# Patient Record
Sex: Male | Born: 1995 | Race: White | Hispanic: No | Marital: Single | Smoking: Current every day smoker
Health system: Southern US, Community
[De-identification: ages and names within clinical notes are randomized; demographics above are authoritative.]

## PROBLEM LIST (undated history)

## (undated) DIAGNOSIS — F32A Depression, unspecified: Secondary | ICD-10-CM

## (undated) DIAGNOSIS — F419 Anxiety disorder, unspecified: Secondary | ICD-10-CM

## (undated) DIAGNOSIS — F329 Major depressive disorder, single episode, unspecified: Secondary | ICD-10-CM

## (undated) HISTORY — DX: Major depressive disorder, single episode, unspecified: F32.9

## (undated) HISTORY — DX: Depression, unspecified: F32.A

## (undated) HISTORY — PX: OTHER SURGICAL HISTORY: SHX169

## (undated) HISTORY — DX: Anxiety disorder, unspecified: F41.9

---

## 2018-02-22 ENCOUNTER — Telehealth: Payer: Self-pay | Admitting: Family Medicine

## 2018-02-22 NOTE — Telephone Encounter (Signed)
Called pt. To reschedule visit with Dr. Clelia CroftShaw on 04/05/18. Left Detailed VM

## 2018-04-05 ENCOUNTER — Ambulatory Visit: Payer: Self-pay | Admitting: Family Medicine

## 2018-04-25 ENCOUNTER — Encounter: Payer: Self-pay | Admitting: Family Medicine

## 2018-04-25 ENCOUNTER — Other Ambulatory Visit: Payer: Self-pay

## 2018-04-25 ENCOUNTER — Ambulatory Visit (INDEPENDENT_AMBULATORY_CARE_PROVIDER_SITE_OTHER): Payer: Managed Care, Other (non HMO) | Admitting: Family Medicine

## 2018-04-25 VITALS — BP 126/82 | HR 71 | Temp 98.0°F | Resp 16 | Ht 68.5 in | Wt 142.0 lb

## 2018-04-25 DIAGNOSIS — F5101 Primary insomnia: Secondary | ICD-10-CM

## 2018-04-25 DIAGNOSIS — F640 Transsexualism: Secondary | ICD-10-CM | POA: Diagnosis not present

## 2018-04-25 DIAGNOSIS — Z79899 Other long term (current) drug therapy: Secondary | ICD-10-CM

## 2018-04-25 DIAGNOSIS — IMO0001 Reserved for inherently not codable concepts without codable children: Secondary | ICD-10-CM

## 2018-04-25 DIAGNOSIS — Z113 Encounter for screening for infections with a predominantly sexual mode of transmission: Secondary | ICD-10-CM

## 2018-04-25 MED ORDER — ESTRADIOL 2 MG PO TABS: ORAL_TABLET | ORAL | 1 refills | Status: AC

## 2018-04-25 MED ORDER — SPIRONOLACTONE 100 MG PO TABS: 100.0000 mg | ORAL_TABLET | Freq: Two times a day (BID) | ORAL | 1 refills | Status: AC

## 2018-04-25 MED ORDER — TRAZODONE HCL 100 MG PO TABS: 100.0000 mg | ORAL_TABLET | Freq: Every day | ORAL | 1 refills | Status: AC

## 2018-04-25 NOTE — Progress Notes (Signed)
Subjective:    Patient: Derek Buckley  DOB: 04/20/1996; 22 y.o.   MRN: [id number]  Chief Complaint  Patient presents with  . Establish Care    hormonal Treatment     HPI Testosterone aggression was really tense and angry all the time but has resolved with starting on hormone therapy on 12/14/2016.  Saw TOL prior for mental health clearance.  Ran out of estradiol Body hair is thicker darker since Emotionally flat  Never tried pills to quit smoking.  Mother took chantix and had horrible dreams Quit 6-7 mos in 2016-2017 - switched over to e-cigs 30  Ml every 2-3 at 67m nicotine and then cut in half. Has been able to switch over to e-cigs for a couple of weeks but can't afford.   Not needed trx for anxiety/depression since 2013 - did not do well on medications as has a lot of psych issues surrounding taking meds. Anxiety and depression stem from the insomnia which responds well to trazodone. She built up a tolerance so has changed to prn. Started taking trazodone 50 in July 2019 but after 4 mos wasn't knocking her at as much.  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 Interested in bottom surgery, orchiectomy, and facial feminization surgery.   STD screening today - last void ~3-4 hrs prior.    THinks got HPV series.   Medical History Past Medical History:  Diagnosis Date  . Anxiety   . Depression    Past Surgical History:  Procedure Laterality Date  . Broken Arm     Current Outpatient Medications on File Prior to Visit  Medication Sig Dispense Refill  . EPINEPHrine (EPIPEN JR) 0.15 MG/0.3ML injection Inject into the muscle.    . estradiol (ESTRACE) 2 MG tablet TAKE 1 TABLET BY MOUTH TWICE A DAY    . spironolactone (ALDACTONE) 100 MG tablet TAKE 1 TABLET BY MOUTH TWICE A DAY    . traZODone (DESYREL) 50 MG tablet TAKE 1 TABLET BY MOUTH EVERY DAY AT NIGHT     No current facility-administered medications  on file prior to visit.    Not on File Family History  Problem Relation Age of Onset  . Cancer Father   . Cancer Maternal Grandmother   . Heart disease Maternal Grandfather   . Heart disease Paternal Grandfather   . Stroke Paternal Grandfather    Social History   Socioeconomic History  . Marital status: Single    Spouse name: Not on file  . Number of children: Not on file  . Years of education: Not on file  . Highest education level: Not on file  Occupational History  . Not on file  Social Needs  . Financial resource strain: Not on file  . Food insecurity:    Worry: Not on file    Inability: Not on file  . Transportation needs:    Medical: Not on file    Non-medical: Not on file  Tobacco Use  . Smoking status: Current Every Day Smoker  . Smokeless tobacco: Never Used  Substance and Sexual Activity  . Alcohol use: Yes  . Drug use: Never  . Sexual activity: Not on file  Lifestyle  . Physical activity:    Days per week: Not on file    Minutes per session: Not on file  . Stress: Not on file  Relationships  . Social connections:    Talks on phone:  Not on file    Gets together: Not on file    Attends religious service: Not on file    Active member of club or organization: Not on file    Attends meetings of clubs or organizations: Not on file    Relationship status: Not on file  Other Topics Concern  . Not on file  Social History Narrative  . Not on file   No flowsheet data found.  ROS As noted in HPI  Objective:  BP 126/82   Pulse 71   Temp 98 F (36.7 C) (Oral)   Resp 16   Ht 5' 8.5" (1.74 m)   Wt 142 lb (64.4 kg)   SpO2 99%   BMI 21.27 kg/m  Physical Exam  Constitutional: She is oriented to person, place, and time. She appears well-developed and well-nourished. No distress.  HENT:  Head: Normocephalic and atraumatic.  Right Ear: External ear normal.  Left Ear: External ear normal.  Eyes: Conjunctivae are normal. No scleral icterus.  Neck:  Normal range of motion. Neck supple. No thyromegaly present.  Cardiovascular: Normal rate, regular rhythm, normal heart sounds and intact distal pulses.  Pulmonary/Chest: Effort normal and breath sounds normal. No respiratory distress.  Musculoskeletal: She exhibits no edema.  Lymphadenopathy:    She has no cervical adenopathy.  Neurological: She is alert and oriented to person, place, and time.  Skin: Skin is warm and dry. She is not diaphoretic. No erythema.  Psychiatric: She has a normal mood and affect. Her behavior is normal.    POC TESTING none  Assessment & Plan:   1. Encounter for long-term current use of high risk medication   2. Screen for STD (sexually transmitted disease)   3. Primary insomnia   4. 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   Reviewed that the physiologic and psychologic changes are unpredictable, can be uncontrollable, and may be irreversible. We discussed that there can be no expectations from cross-sex hormone treatment as how each individual's body and mind is going to respond to a certain hormone regimen to eventually arrive at a certain configuration of sex characteristics is unknown and unpredictable.  Pt understands and agrees. Demonstrates excellent knowledge of the risks/benefits of hormonal treatment which she has reviewed in detail on her own and with therapist prior. Discussed in detail that starting hormonal treatment could cause infertility - may not ever be able to conceive a child naturally or produce sufficient sperm for artificial or in vitro fertilization, even if she should choose to go off of estrogen and anti-androgens later in life. She is not concerned about  this and states that if unable to conceive a child naturally due to previous hormone therapy, would both be happy to adopt. Discussed in detail the possible increased risk for the development of cardiac conditions, vascular disease, and diabetes as baseline higher risk from being genetically male may not be decreased with hormone therapy and could be exacerbated by increased weight/body fat as well as increased thrombotic risk from estrogen.   Also could experience acne, emotional lability, worsening depression/mood sxs, change/decrease in libido, erectile dysfunction, weight gain, migraines, thrombosis/venous thromboembolism, potential to develop/worsen autoimmune disease, potential for benign and malignant liver tumors and  hepatic dysfunction, potential for development of pituitary adenomas/prolactinomas and galactorrhea, and increased risk of breast cancer, in addition to other unknown or unpredictable effects.  Discussed that the physiologic changes induced by hormones may take anywhere from 3 mos to 2 yrs to begin and may even take up to 5 years to reach full effect.    Derek Buckley brought letter from psychologist at [facility] stating that she has undergone psychological assessment to fulfill the requirements for cross-sex hormone treatment. Has met all the eligibility and readiness criteria outlined in the office WPATH Standards of Care, 7th edition for the treatment of transgendered individuals. Is competent, has the capacity to make a fully informed decision, and is capable of consenting to treatment. Derek Buckley is a fit candidate for cross-sex hormone therapy.  Patient will continue on current chronic medications other than changes noted above, so ok to refill when needed.   See after visit summary for patient specific instructions.  Orders Placed This Encounter  Procedures  . CBC with Differential/Platelet  . Comprehensive metabolic panel  . Estradiol  .  TestT+TestF+SHBG    Meds ordered this encounter  Medications  . estradiol (ESTRACE) 2 MG tablet    Sig: Take 1 tab po qam and 2 tabs po qhs    Dispense:  270 tablet    Refill:  1  . spironolactone (ALDACTONE) 100 MG tablet    Sig: Take 1 tablet (100 mg total) by mouth 2 (two) times daily.    Dispense:  180 tablet    Refill:  1  . traZODone (DESYREL) 100 MG tablet    Sig: Take 1 tablet (100 mg total) by mouth at bedtime.    Dispense:  90 tablet    Refill:  1    Patient verbalized to me that they understand the following: diagnosis, what is being done for them, what to expect and what should be done at home.  Their questions have been answered. They understand that I am unable to predict every possible medication interaction or adverse outcome and that if any unexpected symptoms arise, they should contact us and their pharmacist, as well as never hesitate to seek urgent/emergent care at [facility] or ER if they think it might be warranted.    Delman Cheadle, MD, MPH [facility] [address] [place], [place]  [address] [phone] Office phone  [phone] Office fax  04/25/18 11:03 AM

## 2018-04-25 NOTE — Patient Instructions (Addendum)
If you would like, you are more than welcome to come in for a lab only visit to have your blood drawn ~4-6 days prior to our next scheduled office visit so that the results are available for us to review together and discuss any implications during your visit.  You do NOT need to fasting for your labs.  You may also walk in at any time for your flu shot. When you have your labs drawn, try to make sure it is at least 3-4 hrs AFTER your morning estradiol dose OR hold your morning estradiol dose and don't take it until after your labs are drawn that day if you are going to come in in the morning for blood work/appt.    If you have lab work done today you will be contacted with your lab results within the next 2 weeks.  If you have not heard from us then please contact us. The fastest way to get your results is to register for My Chart.   IF you received an x-ray today, you will receive an invoice from [facility]. Please contact [facility] at [phone] with questions or concerns regarding your invoice.   IF you received labwork today, you will receive an invoice from [facility]. Please contact [facility] at [phone] with questions or concerns regarding your invoice.   Our billing staff will not be able to assist you with questions regarding bills from these companies.  You will be contacted with the lab results as soon as they are available. The fastest way to get your results is to activate your My Chart account. Instructions are located on the last page of this paperwork. If you have not heard from us regarding the results in 2 weeks, please contact this office.     Coping with Quitting Smoking Quitting smoking is a physical and mental challenge. You will face cravings, withdrawal symptoms, and temptation. Before quitting, work with your health care provider to make a plan that can help you cope. Preparation can help you quit and keep you from giving in. How can I cope  with cravings? Cravings usually last for 5-10 minutes. If you get through it, the craving will pass. Consider taking the following actions to help you cope with cravings:  Keep your mouth busy: ? Chew sugar-free gum. ? Suck on hard candies or a straw. ? Brush your teeth.  Keep your hands and body busy: ? Immediately change to a different activity when you feel a craving. ? Squeeze or play with a ball. ? Do an activity or a hobby, like making bead jewelry, practicing needlepoint, or working with wood. ? Mix up your normal routine. ? Take a short exercise break. Go for a quick walk or run up and down stairs. ? Spend time in public places where smoking is not allowed.  Focus on doing something kind or helpful for someone else.  Call a friend or family member to talk during a craving.  Join a support group.  Call a quit line, such as 1-800-QUIT-NOW.  Talk with your health care provider about medicines that might help you cope with cravings and make quitting easier for you.  How can I deal with withdrawal symptoms? Your body may experience negative effects as it tries to get used to not having nicotine in the system. These effects are called withdrawal symptoms. They may include:  Feeling hungrier than normal.  Trouble concentrating.  Irritability.  Trouble sleeping.  Feeling depressed.  Restlessness and agitation.  Craving a cigarette.  To manage withdrawal symptoms:  Avoid places, people, and activities that trigger your cravings.  Remember why you want to quit.  Get plenty of sleep.  Avoid coffee and other caffeinated drinks. These may worsen some of your symptoms.  How can I handle social situations? Social situations can be difficult when you are quitting smoking, especially in the first few weeks. To manage this, you can:  Avoid parties, bars, and other social situations where people might be smoking.  Avoid alcohol.  Leave right away if you have the urge  to smoke.  Explain to your family and friends that you are quitting smoking. Ask for understanding and support.  Plan activities with friends or family where smoking is not an option.  What are some ways I can cope with stress? Wanting to smoke may cause stress, and stress can make you want to smoke. Find ways to manage your stress. Relaxation techniques can help. For example:  Breathe slowly and deeply, in through your nose and out through your mouth.  Listen to soothing, relaxing music.  Talk with a family member or friend about your stress.  Light a candle.  Soak in a bath or take a shower.  Think about a peaceful place.  What are some ways I can prevent weight gain? Be aware that many people gain weight after they quit smoking. However, not everyone does. To keep from gaining weight, have a plan in place before you quit and stick to the plan after you quit. Your plan should include:  Having healthy snacks. When you have a craving, it may help to: ? Eat plain popcorn, crunchy carrots, celery, or other cut vegetables. ? Chew sugar-free gum.  Changing how you eat: ? Eat small portion sizes at meals. ? Eat 4-6 small meals throughout the day instead of 1-2 large meals a day. ? Be mindful when you eat. Do not watch television or do other things that might distract you as you eat.  Exercising regularly: ? Make time to exercise each day. If you do not have time for a long workout, do short bouts of exercise for 5-10 minutes several times a day. ? Do some form of strengthening exercise, like weight lifting, and some form of aerobic exercise, like running or swimming.  Drinking plenty of water or other low-calorie or no-calorie drinks. Drink 6-8 glasses of water daily, or as much as instructed by your health care provider.  Summary  Quitting smoking is a physical and mental challenge. You will face cravings, withdrawal symptoms, and temptation to smoke again. Preparation can help  you as you go through these challenges.  You can cope with cravings by keeping your mouth busy (such as by chewing gum), keeping your body and hands busy, and making calls to family, friends, or a helpline for people who want to quit smoking.  You can cope with withdrawal symptoms by avoiding places where people smoke, avoiding drinks with caffeine, and getting plenty of rest.  Ask your health care provider about the different ways to prevent weight gain, avoid stress, and handle social situations. This information is not intended to replace advice given to you by your health care provider. Make sure you discuss any questions you have with your health care provider. Document Released: 05/26/2016 Document Revised: 05/26/2016 Document Reviewed: 05/26/2016 Elsevier Interactive Patient Education  2018 [employer]    Deep Vein Thrombosis Deep vein thrombosis (DVT) is a condition in which a blood clot forms in  a deep vein, such as a lower leg, thigh, or arm vein. A clot is blood that has thickened into a gel or solid. This condition is dangerous. It can lead to serious and even life-threatening complications if the clot travels to the lungs and causes a blockage (pulmonary embolism). It can also damage veins in the leg. This can result in leg pain, swelling, discoloration, and sores (post-thrombotic syndrome). What are the causes? This condition may be caused by:  A slowdown of blood flow.  Damage to a vein.  A condition that makes blood clot more easily.  What increases the risk? The following factors may make you more likely to develop this condition:  Being overweight.  Being elderly, especially over age 22.  Sitting or lying down for more than four hours.  Lack of physical activity (sedentary lifestyle).  Being pregnant, giving birth, or having recently given birth.  Taking medicines that contain estrogen.  Smoking.  A history of any of the following: ? Blood clots or blood  clotting disease. ? Peripheral vascular disease. ? Inflammatory bowel disease. ? Cancer. ? Heart disease. ? Genetic conditions that affect how blood clots. ? Neurological diseases that affect the legs (leg paresis). ? Injury. ? Major or lengthy surgery. ? A central line placed inside a large vein.  What are the signs or symptoms? Symptoms of this condition include:  Swelling, pain, or tenderness in an arm or leg.  Warmth, redness, or discoloration in an arm or leg.  If the clot is in your leg, symptoms may be more noticeable or worse when you stand or walk. Some people do not have any symptoms. How is this diagnosed? This condition is diagnosed with:  A medical history.  A physical exam.  Tests, such as: ? Blood tests. These are done to see how your blood clots. ? Imaging tests. These are done to check for clots. Tests may include:  Ultrasound.  CT scan.  MRI.  X-ray.  Venogram. For this test, X-rays are taken after a dye is injected into a vein.  How is this treated? Treatment for this condition depends on the cause, your risk for bleeding or developing more clots, and any medical conditions you have. Treatment may include:  Taking blood thinners (also called anticoagulants). These medicines may be taken by mouth, injected under the skin, or injected through an IV tube (catheter). These medicines prevent clots from forming.  Injecting medicine that dissolves blood clots into the affected vein (catheter-directed thrombolysis).  Having surgery. Surgery may be done to: ? Remove the clot. ? Place a filter in a large vein to catch blood clots before they reach the lungs.  Some treatments may be continued for up to six months. Follow these instructions at home: If you are taking an oral blood thinner:  Take the medicine exactly as told by your health care provider. Some blood thinners need to be taken at the same time every day. Do not skip a dose.  Ask your health  care provider about what foods and drugs interact with the medicine.  Ask about possible side effects. General instructions  Blood thinners can cause easy bruising and difficulty stopping bleeding. Because of this, if you are taking or were given a blood thinner: ? Hold pressure over cuts for longer than usual. ? Tell your dentist and other health care providers that you are taking blood thinners before having any procedures that can cause bleeding. ? Avoid contact sports.  Take over-the-counter and prescription  medicines only as told by your health care provider.  Return to your normal activities as told by your health care provider. Ask your health care provider what activities are safe for you.  Wear compression stockings if recommended by your health care provider.  Keep all follow-up visits as told by your health care provider. This is important. How is this prevented? To lower your risk of developing this condition again:  For 30 or more minutes every day, do an activity that: ? Involves moving your arms and legs. ? Increases your heart rate.  When traveling for longer than four hours: ? Exercise your arms and legs every hour. ? Drink plenty of water. ? Avoid drinking alcohol.  Avoid sitting or lying for a long time without moving your legs.  Stay a healthy weight.  If you are a woman who is older than age 3, avoid unnecessary use of medicines that contain estrogen.  Do not use any products that contain nicotine or tobacco, such as cigarettes and e-cigarettes. This is especially important if you take estrogen medicines. If you need help quitting, ask your health care provider.  Contact a health care provider if:  You miss a dose of your blood thinner.  You have nausea, vomiting, or diarrhea that lasts for more than one day.  Your menstrual period is heavier than usual.  You have unusual bruising. Get help right away if:  You have new or increased pain, swelling,  or redness in an arm or leg.  You have numbness or tingling in an arm or leg.  You have shortness of breath.  You have chest pain.  You have a rapid or irregular heartbeat.  You feel light-headed or dizzy.  You cough up blood.  There is blood in your vomit, stool, or urine.  You have a serious fall or accident, or you hit your head.  You have a severe headache or confusion.  You have a cut that will not stop bleeding. These symptoms may represent a serious problem that is an emergency. Do not wait to see if the symptoms will go away. Get medical help right away. Call your local emergency services (911 in the [place] Do not drive yourself to the hospital. Summary  DVT is a condition in which a blood clot forms in a deep vein, such as a lower leg, thigh, or arm vein.  Symptoms can include swelling, warmth, pain, and redness in your leg or arm.  Treatment may include taking blood thinners, injecting medicine that dissolves blood clots,wearing compression stockings, or surgery.  If you are prescribed blood thinners, take them exactly as told. This information is not intended to replace advice given to you by your health care provider. Make sure you discuss any questions you have with your health care provider. Document Released: 05/29/2005 Document Revised: 07/01/2016 Document Reviewed: 07/01/2016 Elsevier Interactive Patient Education  2018 [employer].

## 2018-04-26 LAB — HCV AB W/RFLX TO VERIFICATION: HCV Ab: <0.1 s/co ratio (ref 0.0–0.9)

## 2018-04-26 LAB — HCV INTERPRETATION

## 2018-04-26 LAB — HIV ANTIBODY (ROUTINE TESTING W REFLEX): HIV Screen 4th Generation wRfx: NONREACTIVE

## 2018-04-26 LAB — RPR: RPR: NONREACTIVE

## 2018-04-27 LAB — GC/CHLAMYDIA PROBE AMP
CHLAMYDIA, DNA PROBE: NEGATIVE
Chlamydia trachomatis, NAA: NEGATIVE
NEISSERIA GONORRHOEAE BY PCR: NEGATIVE
Neisseria gonorrhoeae by PCR: NEGATIVE

## 2018-04-29 ENCOUNTER — Encounter: Payer: Self-pay | Admitting: Family Medicine

## 2018-04-30 LAB — CT/NG RNA, TMA RECTAL
CHLAMYDIA TRACHOMATIS, NAA: NEGATIVE
NEISSERIA GONORRHOEAE, NAA: NEGATIVE

## 2020-03-24 ENCOUNTER — Other Ambulatory Visit: Payer: Self-pay | Admitting: Family Medicine

## 2020-03-24 ENCOUNTER — Ambulatory Visit
Admission: RE | Admit: 2020-03-24 | Discharge: 2020-03-24 | Disposition: A | Discharge status: Home / Self Care | Payer: Managed Care, Other (non HMO) | Source: Ambulatory Visit | Attending: Family Medicine | Admitting: Family Medicine

## 2020-03-24 DIAGNOSIS — Z9889 Other specified postprocedural states: Secondary | ICD-10-CM

## 2021-03-16 ENCOUNTER — Ambulatory Visit (HOSPITAL_COMMUNITY)
Admission: EM | Admit: 2021-03-16 | Discharge: 2021-03-16 | Disposition: A | Discharge status: Home / Self Care | Payer: Managed Care, Other (non HMO)

## 2021-03-16 ENCOUNTER — Other Ambulatory Visit: Payer: Self-pay

## 2021-03-16 DIAGNOSIS — S61210A Laceration without foreign body of right index finger without damage to nail, initial encounter: Secondary | ICD-10-CM

## 2021-03-16 MED ORDER — LIDOCAINE HCL 2 % IJ SOLN
INTRAMUSCULAR | Status: AC
  Filled 2021-03-16: qty 20

## 2021-03-16 MED ORDER — DOXYCYCLINE HYCLATE 100 MG PO CAPS: 100.0000 mg | ORAL_CAPSULE | Freq: Two times a day (BID) | ORAL | 0 refills | Status: AC

## 2021-03-16 NOTE — ED Provider Notes (Signed)
[facility]    CSN: [id number] Arrival date & time: 03/16/21  1912      History   Chief Complaint Chief Complaint  Patient presents with   Finger Injury    HPI Derek Buckley is a 25 y.o. adult.   HPI  Finger Injury: Pt reports that one hour ago she was cutting come garlic with a non serrated chefs knife when she accidentally slipped and cut her right 2nd finger. Mild bleeding. Sharp pain. She is able to feel the finger and has no numbness or tingling. She reports that her last Tdap was 4 years ago. She has not taken anything or put anyone on the finger.   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 Authorizing Provider  EPINEPHrine (EPIPEN JR) 0.15 MG/0.3ML injection Inject into the muscle.    [provider]  estradiol (ESTRACE) 2 MG tablet Take 1 tab po qam and 2 tabs po qhs 04/25/18   Sherren Mocha, MD  estradiol valerate (DELESTROGEN) 20 MG/ML injection Inject into the muscle. 02/09/21   [provider]  methylphenidate 27 MG PO CR tablet Take 27 mg by mouth every morning. 03/10/21   [provider]  spironolactone (ALDACTONE) 100 MG tablet Take 1 tablet (100 mg total) by mouth 2 (two) times daily. 04/25/18   Sherren Mocha, MD  traZODone (DESYREL) 100 MG tablet Take 1 tablet (100 mg total) by mouth at bedtime. 04/25/18   Sherren Mocha, MD    Family History Family History  Problem Relation Age of Onset   Cancer Father    Cancer Maternal Grandmother    Heart disease Maternal Grandfather    Heart disease Paternal  Grandfather    Stroke Paternal Grandfather     Social History Social History   Tobacco Use   Smoking status: Every Day   Smokeless tobacco: Never  Substance Use Topics   Alcohol use: Yes   Drug use: Never     Allergies   Patient has no known allergies.   Review of Systems Review of Systems  As stated above in HPI Physical Exam Triage Vital Signs ED Triage Vitals  Enc Vitals Group     BP 03/16/21 1935 138/82     Pulse Rate 03/16/21 1935 84     Resp 03/16/21 1935 16     Temp 03/16/21 1935 99 F (37.2 C)     Temp Source 03/16/21 1935 Oral     SpO2 03/16/21 1935 97 %     Weight --      Height --      Head Circumference --      Peak Flow --      Pain Score 03/16/21 1933 4     Pain Loc --      Pain Edu? --      Excl. in GC? --    No data found.  Updated Vital Signs BP 138/82 (BP Location: Right Arm)   Pulse 84   Temp 99 F (37.2 C) (Oral)  Resp 16   SpO2 97%   Physical Exam Vitals and nursing note reviewed.  Constitutional:      General: She is not in acute distress.    Appearance: Normal appearance. She is not ill-appearing, toxic-appearing or diaphoretic.  Cardiovascular:     Pulses: Normal pulses.  Skin:    General: Skin is warm.     Capillary Refill: Capillary refill takes less than 2 seconds.  Neurological:     Mental Status: She is alert and oriented to person, place, and time.     Sensory: No sensory deficit.     UC Treatments / Results  Labs (all labs ordered are listed, but only abnormal results are displayed) Labs Reviewed - No data to display  EKG   Radiology No results found.  Procedures Laceration Repair  Date/Time: 03/16/2021 8:37 PM Performed by: Rushie Chestnut, PA-C Authorized by: Rushie Chestnut, PA-C   Consent:    Consent obtained:  Verbal   Consent given by:  Patient   Risks discussed:  Infection, need for additional repair, pain, poor cosmetic result, poor wound healing, tendon damage, vascular damage and  nerve damage   Alternatives discussed:  No treatment and delayed treatment Universal protocol:    Procedure explained and questions answered to patient or proxy's satisfaction: yes     Relevant documents present and verified: yes     Test results available: yes     Imaging studies available: yes     Required blood products, implants, devices, and special equipment available: yes     Site/side marked: yes     Immediately prior to procedure, a time out was called: yes     Patient identity confirmed:  Verbally with patient and provided demographic data Anesthesia:    Anesthesia method:  Local infiltration   Local anesthetic:  Lidocaine 2% w/o epi Laceration details:    Location:  Finger   Finger location:  R index finger   Length (cm):  11   Depth (mm):  2 Pre-procedure details:    Preparation:  Patient was prepped and draped in usual sterile fashion Exploration:    Limited defect created (wound extended): yes     Hemostasis achieved with:  Direct pressure   Imaging outcome: foreign body not noted     Wound exploration: wound explored through full range of motion and entire depth of wound visualized     Wound extent: no foreign bodies/material noted, no muscle damage noted, no nerve damage noted, no tendon damage noted, no underlying fracture noted and no vascular damage noted     Contaminated: no   Treatment:    Area cleansed with:  Shur-Clens and chlorhexidine   Amount of cleaning:  Standard   Irrigation solution:  Sterile saline   Irrigation volume:  1cc   Irrigation method:  Syringe   Visualized foreign bodies/material removed: no     Debridement:  None   Undermining:  None   Scar revision: no     Layers repaired: subcutaneous. Skin repair:    Repair method:  Sutures   Suture size:  5-0   Suture material:  Prolene   Suture technique:  Simple interrupted   Number of sutures:  5 Approximation:    Approximation:  Close Repair type:    Repair type:  Simple Post-procedure  details:    Dressing:  Bulky dressing   Procedure completion:  Tolerated well, no immediate complications (including critical care time)  Medications Ordered in UC Medications - No data  to display  Initial Impression / Assessment and Plan / UC Course  I have reviewed the triage vital signs and the nursing notes.  Pertinent labs & imaging results that were available during my care of the patient were reviewed by me and considered in my medical decision making (see chart for details).     New. Wound is clean but given location and type of knife she will obtain antibiotics to prevent infection. Discussed options regarding closure of the wound. She elects suture placement. Discussed wound care and return in 7-10 days for removal. Discussed red flag signs and symptoms. Follow up PRN.  Final Clinical Impressions(s) / UC Diagnoses   Final diagnoses:  None   Discharge Instructions   None    ED Prescriptions   None    PDMP not reviewed this encounter.   Rushie Chestnut, [place] 03/16/21 2040

## 2021-03-16 NOTE — ED Triage Notes (Signed)
Pt presents with right finger laceration after cutting with a chef's knife approx 40 minutes ago.

## 2021-09-24 IMAGING — CR DG FOREARM 2V*L*
2 series · 2 of 2 positions shown · non-contrast
Comparison: None.

CLINICAL DATA: Left forearm hardware pain. Prior ORIF 10 years ago.

EXAM:
LEFT FOREARM - 2 VIEW

[x forearm ap left]
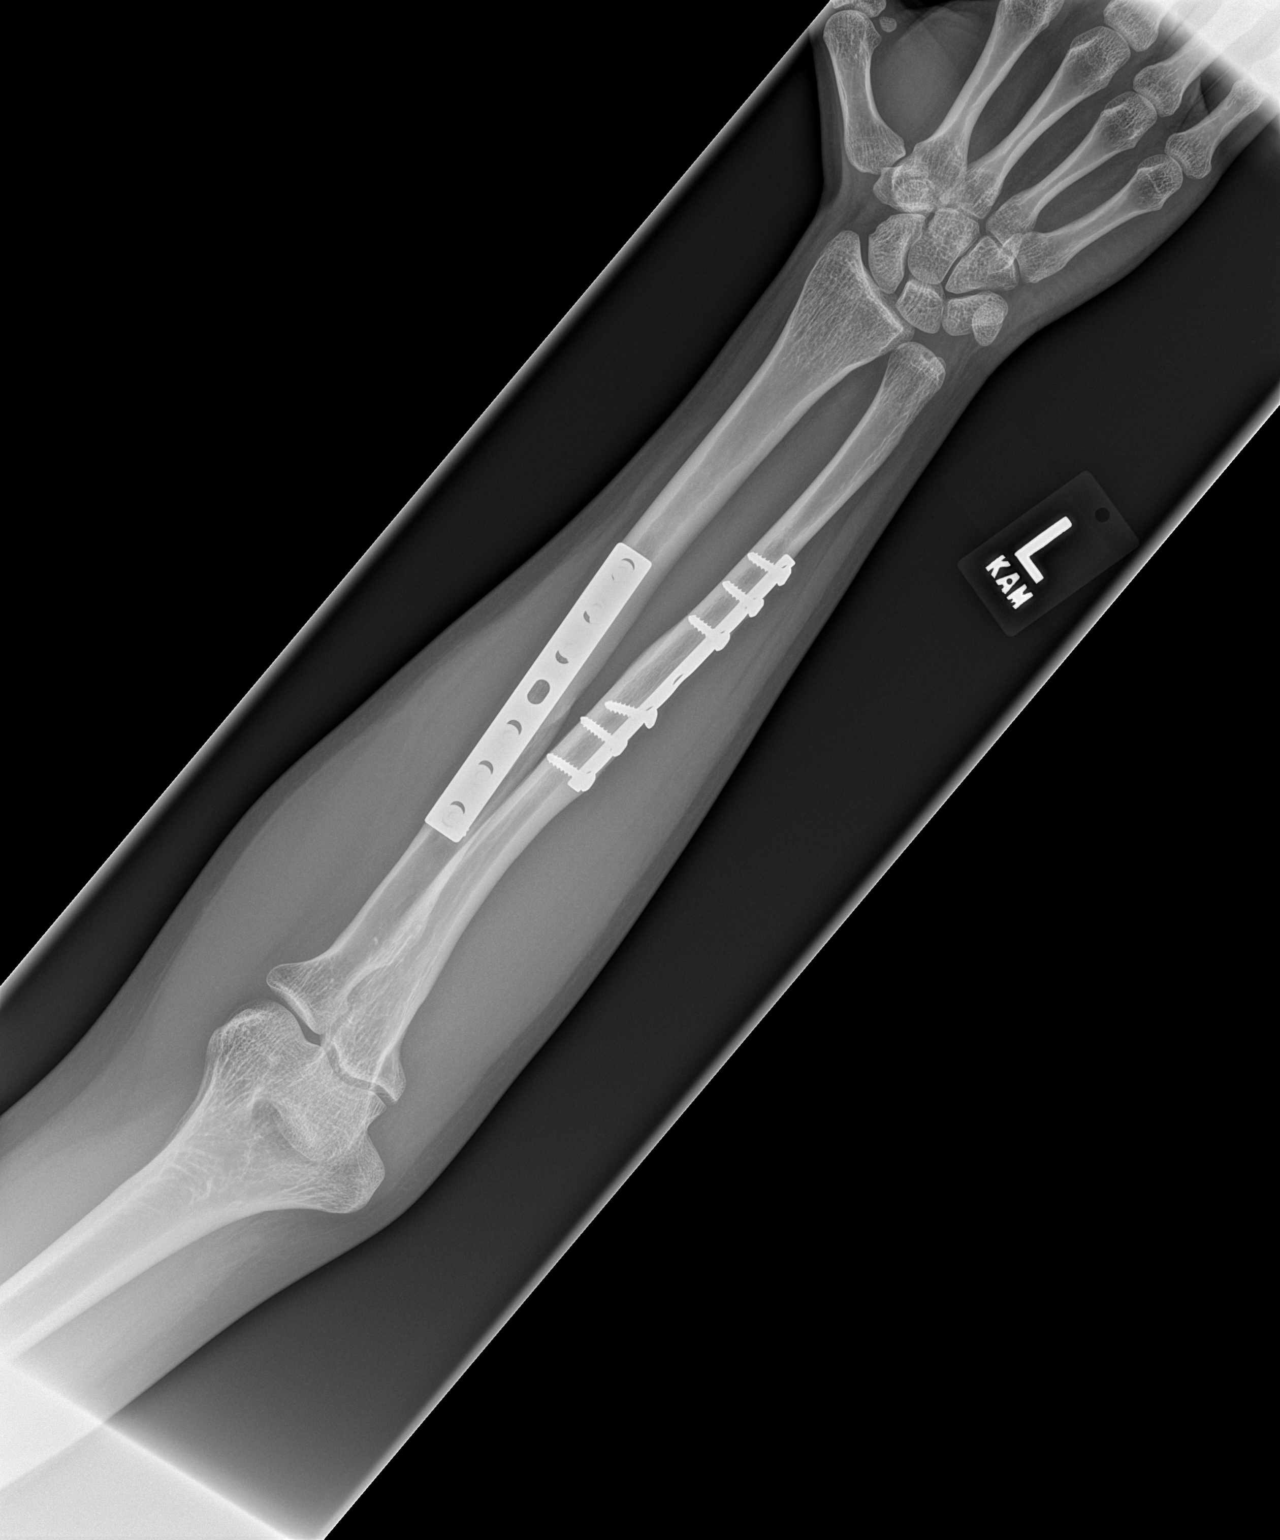

[x forearm lat left]
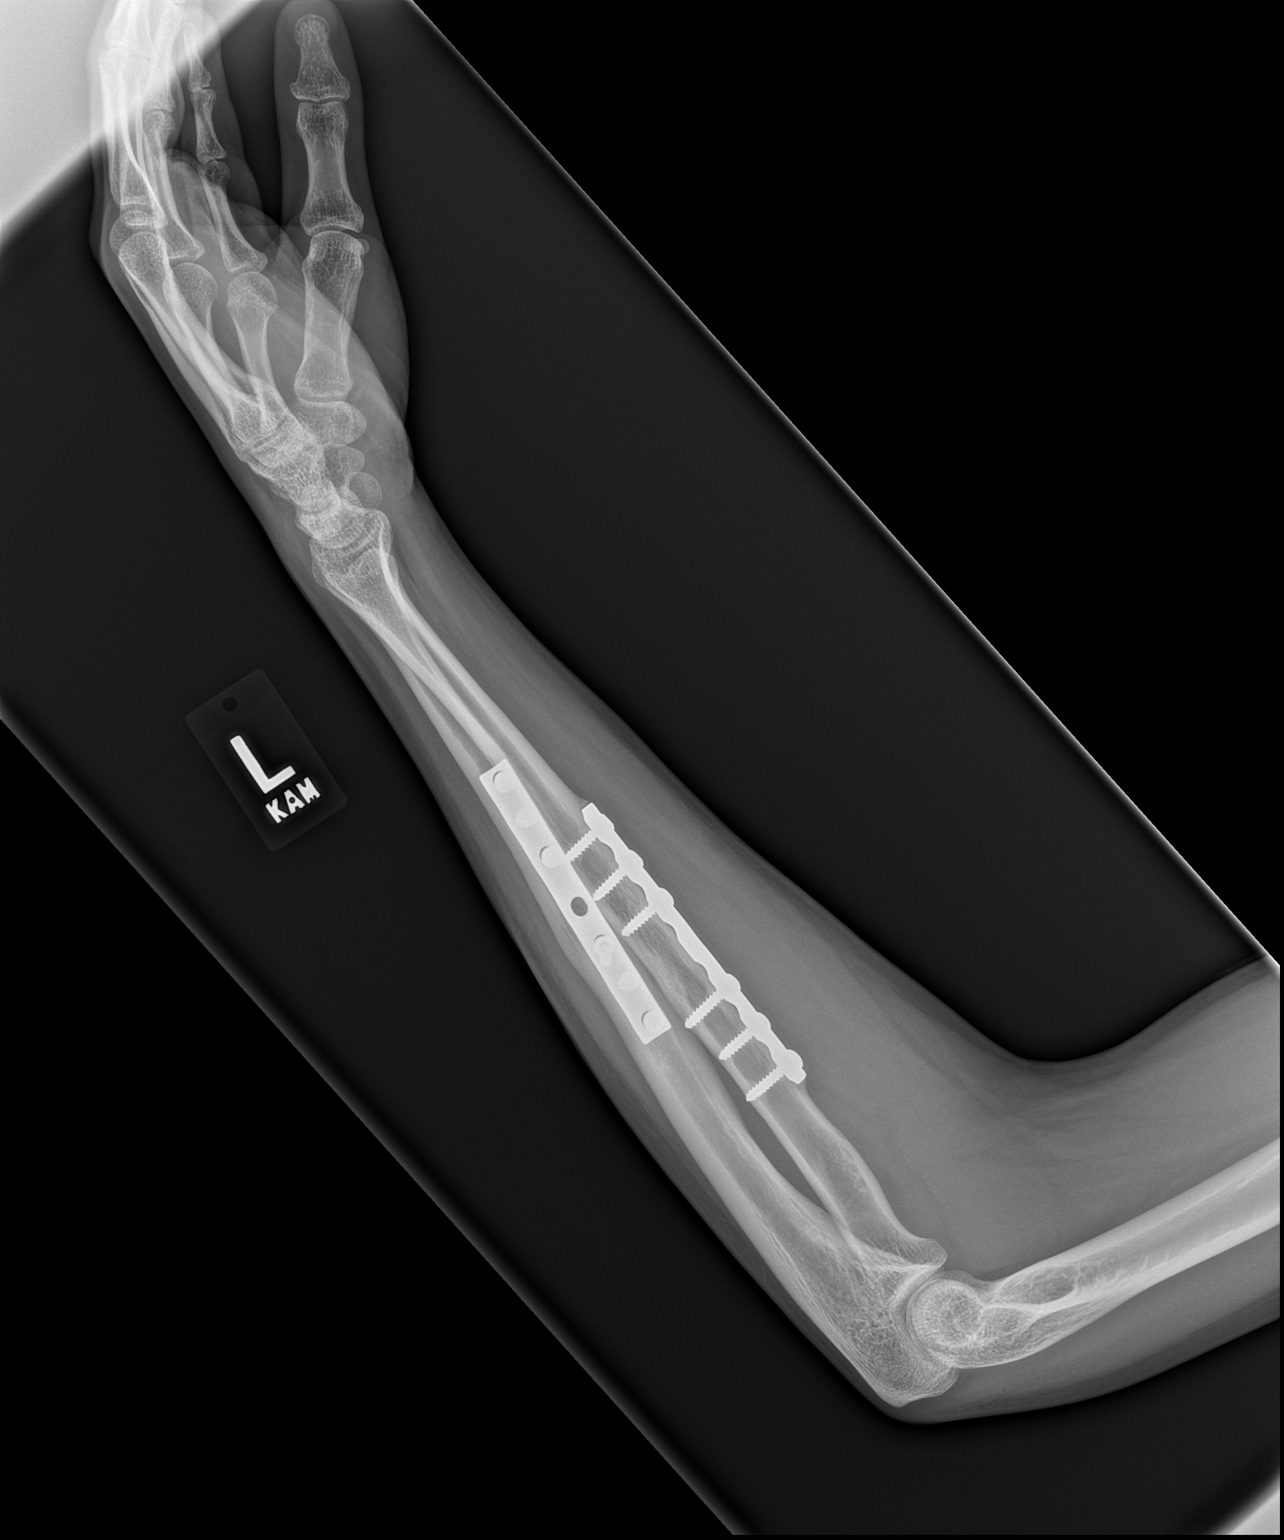

[2 of 2 positions shown; findings below may reference images not displayed]

FINDINGS: No acute fracture or dislocation. Old healed fractures of the mid
radius and ulna diaphyses status post plate and screw fixation. No
evidence of hardware failure or loosening. Joint spaces are
preserved. Bone mineralization is normal. Soft tissues are
unremarkable.
IMPRESSION: 1. No acute osseous abnormality. Old healed radius and ulna
diaphyseal fractures status post ORIF without hardware complication.
